# Patient Record
Sex: Female | Born: 1965 | Race: Black or African American | Hispanic: No | Marital: Single | Smoking: Never smoker
Health system: Southern US, Community
[De-identification: ages and names within clinical notes are randomized; demographics above are authoritative.]

## PROBLEM LIST (undated history)

## (undated) DIAGNOSIS — M1990 Unspecified osteoarthritis, unspecified site: Secondary | ICD-10-CM

## (undated) DIAGNOSIS — D649 Anemia, unspecified: Secondary | ICD-10-CM

## (undated) DIAGNOSIS — F329 Major depressive disorder, single episode, unspecified: Secondary | ICD-10-CM

## (undated) DIAGNOSIS — K219 Gastro-esophageal reflux disease without esophagitis: Secondary | ICD-10-CM

## (undated) DIAGNOSIS — I10 Essential (primary) hypertension: Secondary | ICD-10-CM

## (undated) DIAGNOSIS — F32A Depression, unspecified: Secondary | ICD-10-CM

## (undated) HISTORY — PX: COLONOSCOPY: SHX174

## (undated) HISTORY — DX: Gastro-esophageal reflux disease without esophagitis: K21.9

## (undated) HISTORY — PX: WISDOM TOOTH EXTRACTION: SHX21

## (undated) HISTORY — DX: Anemia, unspecified: D64.9

## (undated) HISTORY — DX: Depression, unspecified: F32.A

## (undated) HISTORY — PX: OTHER SURGICAL HISTORY: SHX169

## (undated) HISTORY — PX: OVARIAN CYST REMOVAL: SHX89

## (undated) HISTORY — PX: COLONOSCOPY W/ POLYPECTOMY: SHX1380

## (undated) HISTORY — PX: POLYPECTOMY: SHX149

## (undated) HISTORY — DX: Unspecified osteoarthritis, unspecified site: M19.90

---
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
## 1998-11-08 ENCOUNTER — Other Ambulatory Visit: Admission: RE | Admit: 1998-11-08 | Discharge: 1998-11-08 | Payer: Self-pay | Admitting: Obstetrics & Gynecology

## 1999-11-22 ENCOUNTER — Emergency Department (HOSPITAL_COMMUNITY): Admission: EM | Admit: 1999-11-22 | Discharge: 1999-11-22 | Payer: Self-pay | Admitting: Emergency Medicine

## 1999-11-23 ENCOUNTER — Encounter: Payer: Self-pay | Admitting: Emergency Medicine

## 2000-02-11 ENCOUNTER — Other Ambulatory Visit: Admission: RE | Admit: 2000-02-11 | Discharge: 2000-02-11 | Payer: Self-pay | Admitting: Obstetrics and Gynecology

## 2001-03-02 ENCOUNTER — Other Ambulatory Visit: Admission: RE | Admit: 2001-03-02 | Discharge: 2001-03-02 | Payer: Self-pay | Admitting: Obstetrics and Gynecology

## 2001-09-20 ENCOUNTER — Encounter: Admission: RE | Admit: 2001-09-20 | Discharge: 2001-09-20 | Payer: Self-pay | Admitting: Internal Medicine

## 2001-09-20 ENCOUNTER — Encounter: Payer: Self-pay | Admitting: Internal Medicine

## 2002-03-13 ENCOUNTER — Other Ambulatory Visit: Admission: RE | Admit: 2002-03-13 | Discharge: 2002-03-13 | Payer: Self-pay | Admitting: Obstetrics and Gynecology

## 2003-03-14 ENCOUNTER — Other Ambulatory Visit: Admission: RE | Admit: 2003-03-14 | Discharge: 2003-03-14 | Payer: Self-pay | Admitting: Obstetrics and Gynecology

## 2003-08-17 ENCOUNTER — Ambulatory Visit (HOSPITAL_COMMUNITY): Admission: RE | Admit: 2003-08-17 | Discharge: 2003-08-17 | Payer: Self-pay | Admitting: Obstetrics and Gynecology

## 2003-09-10 ENCOUNTER — Ambulatory Visit (HOSPITAL_COMMUNITY): Admission: RE | Admit: 2003-09-10 | Discharge: 2003-09-10 | Payer: Self-pay | Admitting: Obstetrics and Gynecology

## 2003-11-06 ENCOUNTER — Encounter (INDEPENDENT_AMBULATORY_CARE_PROVIDER_SITE_OTHER): Payer: Self-pay | Admitting: *Deleted

## 2003-11-06 ENCOUNTER — Ambulatory Visit (HOSPITAL_COMMUNITY): Admission: RE | Admit: 2003-11-06 | Discharge: 2003-11-06 | Payer: Self-pay | Admitting: Obstetrics and Gynecology

## 2004-03-21 ENCOUNTER — Other Ambulatory Visit: Admission: RE | Admit: 2004-03-21 | Discharge: 2004-03-21 | Payer: Self-pay | Admitting: Obstetrics and Gynecology

## 2004-12-22 ENCOUNTER — Emergency Department (HOSPITAL_COMMUNITY): Admission: EM | Admit: 2004-12-22 | Discharge: 2004-12-22 | Payer: Self-pay | Admitting: Emergency Medicine

## 2005-02-03 ENCOUNTER — Emergency Department (HOSPITAL_COMMUNITY): Admission: EM | Admit: 2005-02-03 | Discharge: 2005-02-03 | Payer: Self-pay | Admitting: Family Medicine

## 2005-03-27 ENCOUNTER — Other Ambulatory Visit: Admission: RE | Admit: 2005-03-27 | Discharge: 2005-03-27 | Payer: Self-pay | Admitting: Obstetrics and Gynecology

## 2005-04-14 ENCOUNTER — Encounter (INDEPENDENT_AMBULATORY_CARE_PROVIDER_SITE_OTHER): Payer: Self-pay | Admitting: Specialist

## 2005-04-14 ENCOUNTER — Ambulatory Visit (HOSPITAL_BASED_OUTPATIENT_CLINIC_OR_DEPARTMENT_OTHER): Admission: RE | Admit: 2005-04-14 | Discharge: 2005-04-14 | Payer: Self-pay | Admitting: General Surgery

## 2007-08-02 ENCOUNTER — Emergency Department (HOSPITAL_COMMUNITY): Admission: EM | Admit: 2007-08-02 | Discharge: 2007-08-02 | Payer: Self-pay | Admitting: Emergency Medicine

## 2008-05-31 ENCOUNTER — Encounter: Admission: RE | Admit: 2008-05-31 | Discharge: 2008-05-31 | Payer: Self-pay | Admitting: Obstetrics and Gynecology

## 2009-06-03 ENCOUNTER — Encounter: Admission: RE | Admit: 2009-06-03 | Discharge: 2009-06-03 | Payer: Self-pay | Admitting: Obstetrics and Gynecology

## 2009-09-20 ENCOUNTER — Ambulatory Visit (HOSPITAL_COMMUNITY): Admission: RE | Admit: 2009-09-20 | Discharge: 2009-09-20 | Payer: Self-pay | Admitting: Family Medicine

## 2010-02-12 ENCOUNTER — Emergency Department (HOSPITAL_COMMUNITY)
Admission: EM | Admit: 2010-02-12 | Discharge: 2010-02-12 | Payer: Self-pay | Source: Home / Self Care | Admitting: Emergency Medicine

## 2010-05-05 ENCOUNTER — Other Ambulatory Visit: Payer: Self-pay | Admitting: Obstetrics and Gynecology

## 2010-05-05 DIAGNOSIS — Z1231 Encounter for screening mammogram for malignant neoplasm of breast: Secondary | ICD-10-CM

## 2010-05-30 ENCOUNTER — Ambulatory Visit
Admission: RE | Admit: 2010-05-30 | Discharge: 2010-05-30 | Disposition: A | Discharge status: Home / Self Care | Payer: Medicare Other | Source: Ambulatory Visit | Attending: Obstetrics and Gynecology | Admitting: Obstetrics and Gynecology

## 2010-05-30 DIAGNOSIS — Z1231 Encounter for screening mammogram for malignant neoplasm of breast: Secondary | ICD-10-CM

## 2010-06-05 ENCOUNTER — Ambulatory Visit: Payer: Self-pay

## 2010-08-01 NOTE — H&P (Signed)
NAME:  Katherine Guzman, Katherine Guzman                           ACCOUNT NO.:  [id number]   MEDICAL RECORD NO.:  [id number]                   PATIENT TYPE:  AMB   LOCATION:  SDC                                  FACILITY:  [facility]   PHYSICIAN:  Naima A. Dillard, M.D.              DATE OF BIRTH:  1965/11/14   DATE OF ADMISSION:  11/06/2003  DATE OF DISCHARGE:                                HISTORY & PHYSICAL   CHIEF COMPLAINT:  Menorrhagia.   Patient is a 45 year old African-American female gravida 2, para 0-0-2-0 who  has a long-standing history of menorrhagia and one time she was anemic and  tried birth control pills and has last tried the patch.  Her last hemoglobin  was noted to be 12.  She was recently on a pill taper which did slow down  the menorrhagia and is on Levlin.  However, sonohysterogram was significant  for two small polyps.  Patient denies having any increased stress, any new  medication, or menopausal symptoms.  She does have some slight cramping with  her menses.   PAST MEDICAL HISTORY:  As above.   PAST SURGICAL HISTORY:  Unremarkable.   PAST OBSTETRICAL HISTORY:  Significant for spontaneous abortion x2 in the  first trimester without D&E.   MEDICATIONS:  Levlin.   ALLERGIES:  Patient has no known drug allergies.   SOCIAL HISTORY:  Negative for alcohol, tobacco, and drug use.   REVIEW OF SYSTEMS:  CARDIOVASCULAR, GASTROINTESTINAL, MUSCULOSKELETAL,  ENDOCRINE:  Unremarkable.  GENITOURINARY:  As above.   PHYSICAL EXAMINATION:  VITAL SIGNS:  Blood pressure 120/80, weight 205  pounds.  GENERAL:  She is a well-developed, well-nourished female in no apparent  distress.  HEART:  Regular rate and rhythm.  LUNGS:  Clear to auscultation bilaterally.  ABDOMEN:  Nondistended, soft, and nontender.  PELVIC:  Vaginal examination is without lesions.  No adnexal masses.  Uterus  is about 10 weeks size and irregular.  EXTREMITIES:  No clubbing, cyanosis, edema.   LABORATORIES:  On  sonohysterogram she had two posterior polyps.  One was 0.8  cm and one was 1 cm.  Hemoglobin was 12.4.  TSH was 1.373.  Prolactin is  normal.  Endometrial biopsy shows _____________ endometrium.  Ultrasound was  significant for a 10-week sized uterus measuring 6.8 x 7.4 cm size with  three fibroids, the largest being 3.3 cm.   ASSESSMENT:  1. Menorrhagia.  2. Fibroid.  3. Endometrial polyp.   PLAN:  D&C, hysteroscopy, polypectomy.  The patient understands the risks  are, but not limited to, bleeding, infection, damage to internal organs such  as bowel, bladder, major blood vessels with perforation of the uterus, also  laceration of the cervix.  Naima A. Normand Sloop, M.D.    NAD/MEDQ  D:  11/06/2003  T:  11/06/2003  Job:  [id number]

## 2010-08-01 NOTE — Op Note (Signed)
NAMETAKERA, RAYL NO.:  [id number]   MEDICAL RECORD NO.:  [id number]          PATIENT TYPE:  AMB   LOCATION:  DSC                          FACILITY:  [facility]   PHYSICIAN:  Gita Kudo, M.D. DATE OF BIRTH:  09-Jul-1965   DATE OF PROCEDURE:  04/14/2005  DATE OF DISCHARGE:                                 OPERATIVE REPORT   OPERATIVE PROCEDURE:  Excision left axillary mass.   SURGEON:  [place].   ANESTHESIA:  General.   PREOPERATIVE DIAGNOSIS:  Left axillary mass - large lipoma versus ectopic  breast tissue.   POSTOP DIAGNOSIS:  Pending pathology, mass was approximately 18 x 12 cm.   CLINICAL SUMMARY:  Overweight 45 year old woman with an enlarging mass in  her left axilla. It is been present since puberty and has enlarged slowly  over the years as she has gained weight and her breasts have gotten larger.   OPERATIVE FINDINGS:  There was some fairly firm fibrofatty tissue that may  have been just fat or could possibly have been breast tissue. In any event,  I removed it.   OPERATIVE PROCEDURE:  Under satisfactory general anesthesia, the patient was  positioned, prepped and draped in standard fashion. During the procedure  total of 40 mL of 0.5% Marcaine with epinephrine was infiltrated for postop  analgesia. An elliptical incision was made with a generous amount of loose  skin included and it was directed in the axis of the axilla. Then cautery  was used to make small flaps and to totally circumscribe the area. I went  medially to the pectoralis muscle, laterally to the latissimus and did not  enter the axilla. The specimen was removed and the wound lavaged with  saline, infiltrated with Marcaine and made hemostatic by cautery or Vicryl  ties. Then the deep subcu was approximated with 2-0 Vicryl and the skin  approximated with staples. A large Blake drain was led in inferiorly and  connected to suction before the wound was closed. A dressing was applied.  The patient went to the recovery room in good condition.           ______________________________  Gita Kudo, M.D.     MRL/MEDQ  D:  04/14/2005  T:  04/14/2005  Job:  [id number]

## 2010-08-01 NOTE — Op Note (Signed)
NAME:  Katherine Guzman, Katherine Guzman                           ACCOUNT NO.:  [id number]   MEDICAL RECORD NO.:  [id number]                   PATIENT TYPE:  AMB   LOCATION:  SDC                                  FACILITY:  [facility]   PHYSICIAN:  Naima A. Dillard, M.D.              DATE OF BIRTH:  [birth year]   DATE OF PROCEDURE:  11/06/2003  DATE OF DISCHARGE:                                 OPERATIVE REPORT   PREOPERATIVE DIAGNOSES:  1. Menorrhagia.  2. Fibroids.  3. Endometrial polyps.   POSTOPERATIVE DIAGNOSES:  1. Menorrhagia.  2. Fibroids.  3. Endometrial polyps.  4. Anterior cervical laceration that was obtained during surgery and also     repaired at surgery.   PROCEDURE:  D&C hysteroscopy, polypectomy, repair of anterior cervical  laceration.   ANESTHESIA:  MAC cervical block.   DEFICIT:  90 mL of 3% sorbitol.   COMPLICATIONS:  None.   FINDINGS:  Uterus descended to 9 cm, both ostia were visualized. There were  two small polyps on the posterior side of the uterus that were visualized.  Submucosal fibroid seen, fluffy endometrium.   DESCRIPTION OF PROCEDURE:  The patient was taken to the operating room where  she was placed in dorsal lithotomy position, given anesthesia, a bivalve  speculum was placed into the vagina, the anterior lip of the cervix was  grasped with a single tooth tenaculum. The cervix was then infiltrated for a  cervical block of 1% lidocaine, total of 10 mL.  The uterus then sounded to  10 cm.  It was then dilated with Shawnie Pons dilators up to a 21 and a  hysteroscope was placed into the uterus cavity.  The findings noted above  were seen and polyp forceps were placed through the uterine cavity and one  polyp was removed. The hysteroscope was then replaced and another polyp was  noted to be there. The cervix was then further dilated with Shawnie Pons dilators  up to 31. During the dilation, the tenaculum released from the anterior lip  of the cervix and a small laceration  occurred. The tenaculum was replaced in  the anterior lip of the cervix, the resectoscope was then placed into the  uterine cavity and the other polyp was then removed. The hysteroscope was  then removed. Endometrial curettings were then obtained with a curette until  a gritty texture was noted. Hemostasis was noted and there was some bleeding  at the anterior laceration site.  Again another  hysteroscope was placed into the uterine cavity and all polyps were seen to  be removed. All instruments were removed from the cervix. The cervix wall  laceration was repaired with a UR-6 on a 2-0 Vicryl suture.  Hemostasis was  noted.  Sponge, lap and needle counts were correct x2.  Hemostasis was noted  at the tenaculum site.  Naima A. Normand Sloop, M.D.    NAD/MEDQ  D:  11/06/2003  T:  11/06/2003  Job:  [id number]

## 2011-03-02 ENCOUNTER — Emergency Department (HOSPITAL_COMMUNITY)
Admission: EM | Admit: 2011-03-02 | Discharge: 2011-03-02 | Disposition: A | Discharge status: Home / Self Care | Payer: Medicare Other | Attending: Emergency Medicine | Admitting: Emergency Medicine

## 2011-03-02 ENCOUNTER — Other Ambulatory Visit: Payer: Self-pay

## 2011-03-02 ENCOUNTER — Encounter (HOSPITAL_COMMUNITY): Payer: Self-pay | Admitting: Emergency Medicine

## 2011-03-02 DIAGNOSIS — I10 Essential (primary) hypertension: Secondary | ICD-10-CM | POA: Insufficient documentation

## 2011-03-02 DIAGNOSIS — F411 Generalized anxiety disorder: Secondary | ICD-10-CM | POA: Insufficient documentation

## 2011-03-02 DIAGNOSIS — R079 Chest pain, unspecified: Secondary | ICD-10-CM | POA: Insufficient documentation

## 2011-03-02 DIAGNOSIS — E119 Type 2 diabetes mellitus without complications: Secondary | ICD-10-CM | POA: Insufficient documentation

## 2011-03-02 DIAGNOSIS — R209 Unspecified disturbances of skin sensation: Secondary | ICD-10-CM | POA: Insufficient documentation

## 2011-03-02 DIAGNOSIS — R42 Dizziness and giddiness: Secondary | ICD-10-CM | POA: Insufficient documentation

## 2011-03-02 DIAGNOSIS — F419 Anxiety disorder, unspecified: Secondary | ICD-10-CM

## 2011-03-02 HISTORY — DX: Essential (primary) hypertension: I10

## 2011-03-02 LAB — POCT I-STAT, CHEM 8
Chloride: 102 mEq/L (ref 96–112)
Creatinine, Ser: 0.9 mg/dL (ref 0.50–1.10)
Glucose, Bld: 82 mg/dL (ref 70–99)
HCT: 38 % (ref 36.0–46.0)
Hemoglobin: 12.9 g/dL (ref 12.0–15.0)
Sodium: 139 mEq/L (ref 135–145)

## 2011-03-02 MED ORDER — LORAZEPAM 1 MG PO TABS: 1.0000 mg | ORAL_TABLET | Freq: Three times a day (TID) | ORAL | Status: AC | PRN

## 2011-03-02 NOTE — ED Notes (Signed)
Pt states onset last pm, headaches, chest discomfort, and dizziness. Pt also states has been stressed out a lot lately and sometimes it is hard to breathe, pt states s/s happen all at once and come and go.

## 2011-03-02 NOTE — ED Provider Notes (Signed)
History     CSN: [id number] Arrival date & time: 03/02/2011 12:06 PM   First MD Initiated Contact with Patient 03/02/11 1521      Chief Complaint  Patient presents with  . Dizziness    (Consider location/radiation/quality/duration/timing/severity/associated sxs/prior treatment) HPI Comments: Pt says that she has had chest pain, tingling in the hands and dizziness on and off since Friday, 3 days ago.  She thinks it may be caused by worry and stress.  There has been on vertigo , no fever, and no presyncopal feeling.  She is diabetic and hypertensive, followed for this by Larita Fife, M.D., her family physician.  Patient is a 45 y.o. female presenting with anxiety.  Anxiety This is a new problem. The current episode started more than 2 days ago. Episode frequency: She feels chest pain, tingling in the hands, and dizziness intermittently. The problem has not changed since onset.Associated symptoms include chest pain. The symptoms are aggravated by nothing. The symptoms are relieved by nothing. She has tried nothing for the symptoms.    Past Medical History  Diagnosis Date  . Diabetes mellitus   . Hypertension     Past Surgical History  Procedure Date  . Colonoscopy w/ polypectomy     History reviewed. No pertinent family history.  History  Substance Use Topics  . Smoking status: Never Smoker   . Smokeless tobacco: Not on file  . Alcohol Use:     OB History    Grav Para Term Preterm Abortions TAB SAB Ect Mult Living                  Review of Systems  Constitutional: Negative.   HENT: Negative.   Eyes: Negative.   Respiratory: Negative.   Cardiovascular: Positive for chest pain.  Gastrointestinal: Negative.   Genitourinary: Negative.   Musculoskeletal: Negative.   Skin: Negative.   Neurological: Positive for dizziness and numbness.  Hematological: Negative.   Psychiatric/Behavioral: Negative.     Allergies  Review of patient's allergies indicates no known  allergies.  Home Medications   Current Outpatient Rx  Name Route Sig Dispense Refill  . ATORVASTATIN CALCIUM 20 MG PO TABS Oral Take 20 mg by mouth daily.      . DESOGESTREL-ETHINYL ESTRADIOL 0.15-30 MG-MCG PO TABS Oral Take 1 tablet by mouth daily.      Marland Kitchen HYDROCHLOROTHIAZIDE 25 MG PO TABS Oral Take 25 mg by mouth daily.      Marland Kitchen OMEPRAZOLE 40 MG PO CPDR Oral Take 40 mg by mouth daily.        BP 145/82  Pulse 88  Temp(Src) 98.8 F (37.1 C) (Oral)  Resp 16  SpO2 100%  LMP 02/26/2011  Physical Exam  Nursing note and vitals reviewed. Constitutional: She is oriented to person, place, and time. She appears well-developed and well-nourished. No distress.  HENT:  Head: Normocephalic and atraumatic.  Mouth/Throat: Oropharynx is clear and moist.  Eyes: Conjunctivae and EOM are normal. Pupils are equal, round, and reactive to light.       No nystagmus.  Neck: Normal range of motion. Neck supple.  Cardiovascular: Normal rate, regular rhythm and intact distal pulses.   Pulmonary/Chest: Effort normal and breath sounds normal.  Abdominal: Soft. Bowel sounds are normal.  Musculoskeletal: Normal range of motion.  Neurological: She is alert and oriented to person, place, and time.       No sensory or motor deficit.  Skin: Skin is warm and dry.  Psychiatric: She has a normal  mood and affect. Her behavior is normal.    ED Course  Procedures (including critical care time)   Labs Reviewed  I-STAT, CHEM 8   3:35 PM Pt seen --> physical exam performed.  ISTAT8 and EKG ordered.  3:46 PM  Date: 03/02/2011  Rate: 77  Rhythm: normal sinus rhythm  QRS Axis: normal  Intervals: normal  ST/T Wave abnormalities: normal  Conduction Disutrbances:none  Narrative Interpretation: Normal EKG  Old EKG Reviewed: none available  5:02 PM Lab and EKG are negative.  Rx Ativan 1 mg q6h prn anxiety.    1. Anxiety          Carleene Cooper III, MD 03/02/11 [phone]

## 2011-04-27 ENCOUNTER — Other Ambulatory Visit: Payer: Self-pay | Admitting: Obstetrics and Gynecology

## 2011-04-27 DIAGNOSIS — Z1231 Encounter for screening mammogram for malignant neoplasm of breast: Secondary | ICD-10-CM

## 2011-05-27 ENCOUNTER — Ambulatory Visit (INDEPENDENT_AMBULATORY_CARE_PROVIDER_SITE_OTHER): Payer: Medicare Other | Admitting: Obstetrics and Gynecology

## 2011-05-27 DIAGNOSIS — Z124 Encounter for screening for malignant neoplasm of cervix: Secondary | ICD-10-CM

## 2011-05-27 DIAGNOSIS — Z0000 Encounter for general adult medical examination without abnormal findings: Secondary | ICD-10-CM

## 2011-06-01 ENCOUNTER — Ambulatory Visit
Admission: RE | Admit: 2011-06-01 | Discharge: 2011-06-01 | Disposition: A | Discharge status: Home / Self Care | Payer: Medicare Other | Source: Ambulatory Visit | Attending: Obstetrics and Gynecology | Admitting: Obstetrics and Gynecology

## 2011-06-01 DIAGNOSIS — Z1231 Encounter for screening mammogram for malignant neoplasm of breast: Secondary | ICD-10-CM

## 2011-11-06 ENCOUNTER — Encounter (INDEPENDENT_AMBULATORY_CARE_PROVIDER_SITE_OTHER): Payer: Medicare Other | Admitting: Ophthalmology

## 2011-11-06 DIAGNOSIS — I10 Essential (primary) hypertension: Secondary | ICD-10-CM

## 2011-11-06 DIAGNOSIS — E11319 Type 2 diabetes mellitus with unspecified diabetic retinopathy without macular edema: Secondary | ICD-10-CM

## 2011-11-06 DIAGNOSIS — H35379 Puckering of macula, unspecified eye: Secondary | ICD-10-CM

## 2011-11-06 DIAGNOSIS — H35039 Hypertensive retinopathy, unspecified eye: Secondary | ICD-10-CM

## 2011-11-06 DIAGNOSIS — E1139 Type 2 diabetes mellitus with other diabetic ophthalmic complication: Secondary | ICD-10-CM

## 2011-11-06 DIAGNOSIS — H3530 Unspecified macular degeneration: Secondary | ICD-10-CM

## 2011-11-06 DIAGNOSIS — H2510 Age-related nuclear cataract, unspecified eye: Secondary | ICD-10-CM

## 2011-11-06 DIAGNOSIS — H43819 Vitreous degeneration, unspecified eye: Secondary | ICD-10-CM

## 2012-04-15 ENCOUNTER — Emergency Department (HOSPITAL_COMMUNITY): Payer: Medicare Other

## 2012-04-15 ENCOUNTER — Encounter (HOSPITAL_COMMUNITY): Payer: Self-pay | Admitting: Emergency Medicine

## 2012-04-15 ENCOUNTER — Emergency Department (HOSPITAL_COMMUNITY)
Admission: EM | Admit: 2012-04-15 | Discharge: 2012-04-15 | Disposition: A | Discharge status: Home / Self Care | Payer: Medicare Other | Attending: Emergency Medicine | Admitting: Emergency Medicine

## 2012-04-15 DIAGNOSIS — Z794 Long term (current) use of insulin: Secondary | ICD-10-CM | POA: Insufficient documentation

## 2012-04-15 DIAGNOSIS — M25531 Pain in right wrist: Secondary | ICD-10-CM

## 2012-04-15 DIAGNOSIS — S59919A Unspecified injury of unspecified forearm, initial encounter: Secondary | ICD-10-CM | POA: Insufficient documentation

## 2012-04-15 DIAGNOSIS — E119 Type 2 diabetes mellitus without complications: Secondary | ICD-10-CM | POA: Insufficient documentation

## 2012-04-15 DIAGNOSIS — Z79899 Other long term (current) drug therapy: Secondary | ICD-10-CM | POA: Insufficient documentation

## 2012-04-15 DIAGNOSIS — E7800 Pure hypercholesterolemia, unspecified: Secondary | ICD-10-CM | POA: Insufficient documentation

## 2012-04-15 DIAGNOSIS — S6990XA Unspecified injury of unspecified wrist, hand and finger(s), initial encounter: Secondary | ICD-10-CM | POA: Insufficient documentation

## 2012-04-15 DIAGNOSIS — Y92410 Unspecified street and highway as the place of occurrence of the external cause: Secondary | ICD-10-CM | POA: Insufficient documentation

## 2012-04-15 DIAGNOSIS — Y939 Activity, unspecified: Secondary | ICD-10-CM | POA: Insufficient documentation

## 2012-04-15 DIAGNOSIS — S59909A Unspecified injury of unspecified elbow, initial encounter: Secondary | ICD-10-CM | POA: Insufficient documentation

## 2012-04-15 DIAGNOSIS — I10 Essential (primary) hypertension: Secondary | ICD-10-CM | POA: Insufficient documentation

## 2012-04-15 MED ORDER — TRAMADOL HCL 50 MG PO TABS: 50.0000 mg | ORAL_TABLET | Freq: Four times a day (QID) | ORAL | Status: AC | PRN

## 2012-04-15 NOTE — ED Provider Notes (Signed)
Medical screening examination/treatment/procedure(s) were performed by non-physician practitioner and as supervising physician I was immediately available for consultation/collaboration.   Dione Booze, MD 04/15/12 [phone]
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
## 2012-05-04 ENCOUNTER — Other Ambulatory Visit: Payer: Self-pay | Admitting: Obstetrics and Gynecology

## 2012-05-04 DIAGNOSIS — Z1231 Encounter for screening mammogram for malignant neoplasm of breast: Secondary | ICD-10-CM

## 2012-06-03 ENCOUNTER — Ambulatory Visit: Payer: Medicare Other

## 2012-07-13 ENCOUNTER — Ambulatory Visit: Payer: Medicare Other

## 2012-07-15 ENCOUNTER — Ambulatory Visit
Admission: RE | Admit: 2012-07-15 | Discharge: 2012-07-15 | Disposition: A | Discharge status: Home / Self Care | Payer: Medicare Other | Source: Ambulatory Visit | Attending: Obstetrics and Gynecology | Admitting: Obstetrics and Gynecology

## 2012-07-15 DIAGNOSIS — Z1231 Encounter for screening mammogram for malignant neoplasm of breast: Secondary | ICD-10-CM
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
## 2013-07-12 ENCOUNTER — Other Ambulatory Visit: Payer: Self-pay

## 2013-07-12 DIAGNOSIS — Z1231 Encounter for screening mammogram for malignant neoplasm of breast: Secondary | ICD-10-CM

## 2013-08-04 ENCOUNTER — Ambulatory Visit
Admission: RE | Admit: 2013-08-04 | Discharge: 2013-08-04 | Disposition: A | Discharge status: Home / Self Care | Payer: Medicare Other | Source: Ambulatory Visit

## 2013-08-04 DIAGNOSIS — Z1231 Encounter for screening mammogram for malignant neoplasm of breast: Secondary | ICD-10-CM

## 2014-06-06 ENCOUNTER — Other Ambulatory Visit: Payer: Self-pay

## 2014-06-06 DIAGNOSIS — Z1231 Encounter for screening mammogram for malignant neoplasm of breast: Secondary | ICD-10-CM

## 2014-06-22 ENCOUNTER — Ambulatory Visit
Admission: RE | Admit: 2014-06-22 | Discharge: 2014-06-22 | Disposition: A | Discharge status: Home / Self Care | Payer: Medicare Other | Source: Ambulatory Visit

## 2014-06-22 DIAGNOSIS — Z1231 Encounter for screening mammogram for malignant neoplasm of breast: Secondary | ICD-10-CM

## 2014-08-06 ENCOUNTER — Ambulatory Visit: Payer: Medicare Other

## 2014-08-08 ENCOUNTER — Ambulatory Visit
Admission: RE | Admit: 2014-08-08 | Discharge: 2014-08-08 | Disposition: A | Discharge status: Home / Self Care | Payer: Medicare Other | Source: Ambulatory Visit

## 2014-08-08 ENCOUNTER — Ambulatory Visit: Admission: RE | Admit: 2014-08-08 | Payer: Medicare Other | Source: Ambulatory Visit

## 2014-08-08 ENCOUNTER — Other Ambulatory Visit: Payer: Self-pay

## 2014-08-08 DIAGNOSIS — Z1231 Encounter for screening mammogram for malignant neoplasm of breast: Secondary | ICD-10-CM

## 2015-06-19 ENCOUNTER — Other Ambulatory Visit: Payer: Self-pay

## 2015-06-19 DIAGNOSIS — Z1231 Encounter for screening mammogram for malignant neoplasm of breast: Secondary | ICD-10-CM

## 2015-07-29 DIAGNOSIS — D259 Leiomyoma of uterus, unspecified: Secondary | ICD-10-CM | POA: Insufficient documentation

## 2015-08-09 ENCOUNTER — Ambulatory Visit
Admission: RE | Admit: 2015-08-09 | Discharge: 2015-08-09 | Disposition: A | Discharge status: Home / Self Care | Payer: Medicare Other | Source: Ambulatory Visit

## 2015-08-09 DIAGNOSIS — Z1231 Encounter for screening mammogram for malignant neoplasm of breast: Secondary | ICD-10-CM

## 2017-09-09 DIAGNOSIS — N926 Irregular menstruation, unspecified: Secondary | ICD-10-CM | POA: Insufficient documentation

## 2017-09-28 DIAGNOSIS — B977 Papillomavirus as the cause of diseases classified elsewhere: Secondary | ICD-10-CM | POA: Insufficient documentation

## 2018-03-23 DIAGNOSIS — G8929 Other chronic pain: Secondary | ICD-10-CM | POA: Insufficient documentation

## 2018-03-23 DIAGNOSIS — M5442 Lumbago with sciatica, left side: Secondary | ICD-10-CM | POA: Insufficient documentation

## 2019-01-06 ENCOUNTER — Encounter: Payer: Self-pay | Admitting: Gastroenterology

## 2019-01-31 ENCOUNTER — Ambulatory Visit (AMBULATORY_SURGERY_CENTER): Payer: Medicare Other | Admitting: *Deleted

## 2019-01-31 ENCOUNTER — Other Ambulatory Visit: Payer: Self-pay

## 2019-01-31 VITALS — Temp 97.3°F | Ht 63.0 in | Wt 217.0 lb

## 2019-01-31 DIAGNOSIS — Z1159 Encounter for screening for other viral diseases: Secondary | ICD-10-CM

## 2019-01-31 DIAGNOSIS — Z1211 Encounter for screening for malignant neoplasm of colon: Secondary | ICD-10-CM

## 2019-01-31 MED ORDER — SUPREP BOWEL PREP KIT 17.5-3.13-1.6 GM/177ML PO SOLN: 1.0000 | Freq: Once | ORAL | 0 refills | Status: AC

## 2019-01-31 NOTE — Progress Notes (Signed)
No egg or soy allergy known to patient  No issues with past sedation with any surgeries  or procedures, no intubation problems  No diet pills per patient No home 02 use per patient  No blood thinners per patient  Pt denies issues with constipation  No A fib or A flutter  EMMI video sent to pt's e mail   cov test 11-24 Tuesday at 46 am   Pt states she was planning to ride the bus here and her sister was planning on coming later- Explained to her sister will have to be here by 80 am with pt-  Pt verbalized understanding   Due to the COVID-19 pandemic we are asking patients to follow these guidelines. Please only bring one care partner. Please be aware that your care partner may wait in the car in the parking lot or if they feel like they will be too hot to wait in the car, they may wait in the lobby on the 4th floor. All care partners are required to wear a mask the entire time (we do not have any that we can provide them), they need to practice social distancing, and we will do a Covid check for all patient's and care partners when you arrive. Also we will check their temperature and your temperature. If the care partner waits in their car they need to stay in the parking lot the entire time and we will call them on their cell phone when the patient is ready for discharge so they can bring the car to the front of the building. Also all patient's will need to wear a mask into building.

## 2019-02-07 ENCOUNTER — Other Ambulatory Visit: Payer: Self-pay | Admitting: Gastroenterology

## 2019-02-07 ENCOUNTER — Ambulatory Visit (INDEPENDENT_AMBULATORY_CARE_PROVIDER_SITE_OTHER): Payer: Medicare Other

## 2019-02-07 DIAGNOSIS — Z1159 Encounter for screening for other viral diseases: Secondary | ICD-10-CM

## 2019-02-08 LAB — SARS CORONAVIRUS 2 (TAT 6-24 HRS): SARS Coronavirus 2: NEGATIVE

## 2019-02-14 ENCOUNTER — Encounter: Payer: Self-pay | Admitting: Gastroenterology

## 2019-02-14 ENCOUNTER — Ambulatory Visit (AMBULATORY_SURGERY_CENTER): Payer: Medicare Other | Admitting: Gastroenterology

## 2019-02-14 ENCOUNTER — Other Ambulatory Visit: Payer: Self-pay

## 2019-02-14 VITALS — BP 121/70 | HR 64 | Temp 98.6°F | Resp 19 | Ht 63.0 in | Wt 217.0 lb

## 2019-02-14 DIAGNOSIS — Z1211 Encounter for screening for malignant neoplasm of colon: Secondary | ICD-10-CM

## 2019-02-14 MED ORDER — SODIUM CHLORIDE 0.9 % IV SOLN: 500.0000 mL | Freq: Once | INTRAVENOUS | Status: DC

## 2019-02-14 NOTE — Op Note (Signed)
[place] Patient Name: Katherine Guzman Procedure Date: 02/14/2019 9:14 AM MRN: [id number] Endoscopist: Ladene Artist , MD Age: 53 Referring MD:  Date of Birth: 01/04/1966 Gender: Female Account #: [id number] Procedure:                Colonoscopy Indications:              Screening for colorectal malignant neoplasm Medicines:                Monitored Anesthesia Care Procedure:                Pre-Anesthesia Assessment:                           - Prior to the procedure, a History and Physical                            was performed, and patient medications and                            allergies were reviewed. The patient's tolerance of                            previous anesthesia was also reviewed. The risks                            and benefits of the procedure and the sedation                            options and risks were discussed with the patient.                            All questions were answered, and informed consent                            was obtained. Prior Anticoagulants: The patient has                            taken no previous anticoagulant or antiplatelet                            agents. ASA Grade Assessment: II - A patient with                            mild systemic disease. After reviewing the risks                            and benefits, the patient was deemed in                            satisfactory condition to undergo the procedure.                           After obtaining informed consent, the colonoscope  was passed under direct vision. Throughout the                            procedure, the patient's blood pressure, pulse, and                            oxygen saturations were monitored continuously. The                            Colonoscope was introduced through the anus and                            advanced to the the cecum, identified by                            appendiceal orifice and  ileocecal valve. The                            ileocecal valve, appendiceal orifice, and rectum                            were photographed. The quality of the bowel                            preparation was good. The colonoscopy was performed                            without difficulty. The patient tolerated the                            procedure well. Scope In: 11:16:38 AM Scope Out: 11:30:14 AM Scope Withdrawal Time: 0 hours 10 minutes 39 seconds  Total Procedure Duration: 0 hours 13 minutes 36 seconds  Findings:                 The perianal and digital rectal examinations were                            normal.                           Internal hemorrhoids were found during                            retroflexion. The hemorrhoids were small and Grade                            I (internal hemorrhoids that do not prolapse).                           The exam was otherwise without abnormality on                            direct and retroflexion views. Complications:            No immediate complications. Estimated blood loss:  None. Estimated Blood Loss:     Estimated blood loss: none. Impression:               - Internal hemorrhoids.                           - The examination was otherwise normal on direct                            and retroflexion views.                           - No specimens collected. Recommendation:           - Repeat colonoscopy in 10 years for screening                            purposes.                           - Patient has a contact number available for                            emergencies. The signs and symptoms of potential                            delayed complications were discussed with the                            patient. Return to normal activities tomorrow.                            Written discharge instructions were provided to the                            patient.                           -  Resume previous diet.                           - Continue present medications. Ladene Artist, MD 02/14/2019 11:33:23 AM This report has been signed electronically.

## 2019-02-14 NOTE — Progress Notes (Signed)
Temp check by:JB Vital check by:CW  The patient states no changes in medical or surgical history since pre-visit screening on 01/31/2019.

## 2019-02-14 NOTE — Patient Instructions (Signed)
Please read handouts provided. Continue present medications.     YOU HAD AN ENDOSCOPIC PROCEDURE TODAY AT THE [facility]:   Refer to the procedure report that was given to you for any specific questions about what was found during the examination.  If the procedure report does not answer your questions, please call your gastroenterologist to clarify.  If you requested that your care partner not be given the details of your procedure findings, then the procedure report has been included in a sealed envelope for you to review at your convenience later.  YOU SHOULD EXPECT: Some feelings of bloating in the abdomen. Passage of more gas than usual.  Walking can help get rid of the air that was put into your GI tract during the procedure and reduce the bloating. If you had a lower endoscopy (such as a colonoscopy or flexible sigmoidoscopy) you may notice spotting of blood in your stool or on the toilet paper. If you underwent a bowel prep for your procedure, you may not have a normal bowel movement for a few days.  Please Note:  You might notice some irritation and congestion in your nose or some drainage.  This is from the oxygen used during your procedure.  There is no need for concern and it should clear up in a day or so.  SYMPTOMS TO REPORT IMMEDIATELY:   Following lower endoscopy (colonoscopy or flexible sigmoidoscopy):  Excessive amounts of blood in the stool  Significant tenderness or worsening of abdominal pains  Swelling of the abdomen that is new, acute  Fever of 100F or higher   For urgent or emergent issues, a gastroenterologist can be reached at any hour by calling [phone].   DIET:  We do recommend a small meal at first, but then you may proceed to your regular diet.  Drink plenty of fluids but you should avoid alcoholic beverages for 24 hours.  ACTIVITY:  You should plan to take it easy for the rest of today and you should NOT DRIVE or use heavy machinery  until tomorrow (because of the sedation medicines used during the test).    FOLLOW UP: Our staff will call the number listed on your records 48-72 hours following your procedure to check on you and address any questions or concerns that you may have regarding the information given to you following your procedure. If we do not reach you, we will leave a message.  We will attempt to reach you two times.  During this call, we will ask if you have developed any symptoms of COVID 19. If you develop any symptoms (ie: fever, flu-like symptoms, shortness of breath, cough etc.) before then, please call [phone].  If you test positive for Covid 19 in the 2 weeks post procedure, please call and report this information to us.    If any biopsies were taken you will be contacted by phone or by letter within the next 1-3 weeks.  Please call us at [phone] if you have not heard about the biopsies in 3 weeks.    SIGNATURES/CONFIDENTIALITY: You and/or your care partner have signed paperwork which will be entered into your electronic medical record.  These signatures attest to the fact that that the information above on your After Visit Summary has been reviewed and is understood.  Full responsibility of the confidentiality of this discharge information lies with you and/or your care-partner. 

## 2019-02-14 NOTE — Progress Notes (Signed)
Report to PACU, RN, vss, BBS= Clear.  

## 2019-02-16 ENCOUNTER — Telehealth: Payer: Self-pay | Admitting: *Deleted

## 2019-02-16 NOTE — Telephone Encounter (Signed)
  Follow up Call-  Call back number 02/14/2019  Post procedure Call Back phone  # [phone]  Permission to leave phone message Yes  Some recent data might be hidden     Patient questions:  Do you have a fever, pain , or abdominal swelling? No. Pain Score  0 *  Have you tolerated food without any problems? Yes.    Have you been able to return to your normal activities? Yes.    Do you have any questions about your discharge instructions: Diet   No. Medications  No. Follow up visit  No.  Do you have questions or concerns about your Care? No.  Actions: * If pain score is 4 or above: No action needed, pain <4.  1. Have you developed a fever since your procedure? no  2.   Have you had an respiratory symptoms (SOB or cough) since your procedure? no  3.   Have you tested positive for COVID 19 since your procedure no  4.   Have you had any family members/close contacts diagnosed with the COVID 19 since your procedure?  no   If yes to any of these questions please route to Joylene John, RN and Alphonsa Gin, Therapist, sports.

## 2020-01-08 ENCOUNTER — Other Ambulatory Visit: Payer: Self-pay

## 2020-01-08 ENCOUNTER — Emergency Department (HOSPITAL_COMMUNITY)
Admission: EM | Admit: 2020-01-08 | Discharge: 2020-01-09 | Disposition: A | Discharge status: Home / Self Care | Payer: Medicare Other | Attending: Emergency Medicine | Admitting: Emergency Medicine

## 2020-01-08 ENCOUNTER — Encounter (HOSPITAL_COMMUNITY): Payer: Self-pay | Admitting: Emergency Medicine

## 2020-01-08 DIAGNOSIS — Z20822 Contact with and (suspected) exposure to covid-19: Secondary | ICD-10-CM | POA: Diagnosis not present

## 2020-01-08 DIAGNOSIS — R1012 Left upper quadrant pain: Secondary | ICD-10-CM | POA: Insufficient documentation

## 2020-01-08 DIAGNOSIS — R112 Nausea with vomiting, unspecified: Secondary | ICD-10-CM | POA: Diagnosis not present

## 2020-01-08 DIAGNOSIS — Z79899 Other long term (current) drug therapy: Secondary | ICD-10-CM | POA: Insufficient documentation

## 2020-01-08 DIAGNOSIS — I10 Essential (primary) hypertension: Secondary | ICD-10-CM | POA: Insufficient documentation

## 2020-01-08 DIAGNOSIS — Z794 Long term (current) use of insulin: Secondary | ICD-10-CM | POA: Diagnosis not present

## 2020-01-08 DIAGNOSIS — E119 Type 2 diabetes mellitus without complications: Secondary | ICD-10-CM | POA: Insufficient documentation

## 2020-01-08 DIAGNOSIS — M791 Myalgia, unspecified site: Secondary | ICD-10-CM | POA: Diagnosis not present

## 2020-01-08 DIAGNOSIS — R52 Pain, unspecified: Secondary | ICD-10-CM | POA: Diagnosis present

## 2020-01-08 LAB — RESPIRATORY PANEL BY RT PCR (FLU A&B, COVID)
Influenza A by PCR: NEGATIVE
Influenza B by PCR: NEGATIVE
SARS Coronavirus 2 by RT PCR: NEGATIVE

## 2020-01-08 NOTE — ED Triage Notes (Signed)
Patient arrives to ED with complaints of generalized body aches, chills, nausea, and headache that started today. Denies CP, SOB, abd pain, and dysuria.

## 2020-01-09 LAB — CBG MONITORING, ED: Glucose-Capillary: 91 mg/dL (ref 70–99)

## 2020-01-09 MED ORDER — ONDANSETRON 4 MG PO TBDP: 4.0000 mg | ORAL_TABLET | Freq: Three times a day (TID) | ORAL | 0 refills | Status: AC | PRN

## 2020-01-09 NOTE — ED Provider Notes (Signed)
[facility] EMERGENCY DEPARTMENT Provider Note  CSN: [id number] Arrival date & time: 01/08/20 1746  Chief Complaint(s) Generalized Body Aches  HPI FABLE HUISMAN is a 54 y.o. female   The history is provided by the patient.  Emesis Severity:  Moderate Duration:  1 day Timing:  Intermittent Number of daily episodes:  5 Quality:  Stomach contents Progression:  Improving Chronicity:  New Recent urination:  Normal Context: not post-tussive and not self-induced   Relieved by:  Nothing Worsened by:  Nothing Associated symptoms: abdominal pain (LUQ, resolved), chills, headaches (mild, resolved) and myalgias   Associated symptoms: no cough, no diarrhea and no fever   Risk factors: diabetes (h/o; used to be on insulin. currently off and diet controlled per patient)   Risk factors: no alcohol use, no prior abdominal surgery, no sick contacts, no suspect food intake and no travel to endemic areas     Past Medical History Past Medical History:  Diagnosis Date  . Anemia    when younger   . Arthritis    kness back hip  . Depression   . Diabetes mellitus   . GERD (gastroesophageal reflux disease)   . Hypertension    Patient Active Problem List   Diagnosis Date Noted  . Chronic midline low back pain with bilateral sciatica 03/23/2018  . Severe obesity (BMI 35.0-39.9) with comorbidity ([place]) 03/23/2018  . HPV (human papilloma virus) infection 09/28/2017  . Irregular periods 09/09/2017  . Uterine leiomyoma 07/29/2015  . Impaired fasting blood sugar 06/03/2011  . Arthritis 04/27/2011  . Essential hypertension 04/27/2011  . GERD (gastroesophageal reflux disease) 04/27/2011   Home Medication(s) Prior to Admission medications   Medication Sig Start Date End Date Taking? Authorizing Provider  acetaminophen (TYLENOL) 500 MG tablet Take by mouth.    [provider]  amLODipine (NORVASC) 2.5 MG tablet Take 2.5 mg by mouth daily. 01/23/19   [provider]  atorvastatin (LIPITOR) 20 MG tablet Take 20 mg by mouth daily.      [provider]  CALCIUM & MAGNESIUM CARBONATES PO Take by mouth. 05/17/15   [provider]  diclofenac (VOLTAREN) 75 MG EC tablet Take by mouth. 03/23/18   [provider]  ferrous sulfate 325 (65 FE) MG tablet Take by mouth. 10/04/18   [provider]  gabapentin (NEURONTIN) 100 MG capsule Take by mouth. 10/04/18   [provider]  hydrochlorothiazide (HYDRODIURIL) 25 MG tablet Take 25 mg by mouth daily.      [provider]  insulin glargine (LANTUS) 100 UNIT/ML injection Inject into the skin at bedtime.      [provider]  lisinopril (ZESTRIL) 10 MG tablet Take by mouth. 01/04/19   [provider]  Multiple Vitamin (MULTIVITAMIN) capsule Take by mouth.    [provider]  norethindrone (MICRONOR) 0.35 MG tablet Take by mouth. 05/23/14   [provider]  omeprazole (PRILOSEC) 40 MG capsule Take 40 mg by mouth daily.      [provider]  ondansetron (ZOFRAN ODT) 4 MG disintegrating tablet Take 1 tablet (4 mg total) by mouth every 8 (eight) hours as needed for up to 3 days for nausea or vomiting. 01/09/20 01/12/20  Fatima Blank, MD  traMADol (ULTRAM) 50 MG tablet Take 1 tablet (50 mg total) by mouth every 6 (six) hours as needed for pain. 04/15/12   Muthersbaugh, Jarrett Soho, PA-C  Past Surgical History Past Surgical History:  Procedure Laterality Date  . COLONOSCOPY    . COLONOSCOPY W/ POLYPECTOMY    . cyst removed from axilla area    . OVARIAN CYST REMOVAL    . POLYPECTOMY    . WISDOM TOOTH EXTRACTION     Family History Family History  Problem Relation Age of Onset  . Colon cancer Neg Hx   . Colon polyps Neg Hx   . Esophageal cancer Neg Hx   . Rectal cancer Neg Hx   .  Stomach cancer Neg Hx     Social History Social History   Tobacco Use  . Smoking status: Never Smoker  . Smokeless tobacco: Never Used  Substance Use Topics  . Alcohol use: Never  . Drug use: Never   Allergies Lisinopril and Penicillins  Review of Systems Review of Systems  Constitutional: Positive for chills. Negative for fever.  Respiratory: Negative for cough.   Gastrointestinal: Positive for abdominal pain (LUQ, resolved) and vomiting. Negative for diarrhea.  Musculoskeletal: Positive for myalgias.  Neurological: Positive for headaches (mild, resolved).   All other systems are reviewed and are negative for acute change except as noted in the HPI  Physical Exam Vital Signs  I have reviewed the triage vital signs BP (!) 142/94 (BP Location: Left Arm)   Pulse 66   Temp 98.2 F (36.8 C) (Oral)   Resp 17   Ht 5\' 3"  (1.6 m)   Wt 103.4 kg   SpO2 97%   BMI 40.39 kg/m   Physical Exam Vitals reviewed.  Constitutional:      General: She is not in acute distress.    Appearance: She is well-developed. She is not diaphoretic.  HENT:     Head: Normocephalic and atraumatic.     Right Ear: External ear normal.     Left Ear: External ear normal.     Nose: Nose normal.  Eyes:     General: No scleral icterus.    Conjunctiva/sclera: Conjunctivae normal.  Neck:     Trachea: Phonation normal.  Cardiovascular:     Rate and Rhythm: Normal rate and regular rhythm.  Pulmonary:     Effort: Pulmonary effort is normal. No respiratory distress.     Breath sounds: No stridor.  Abdominal:     General: There is no distension.     Tenderness: There is no abdominal tenderness. There is no guarding or rebound.  Musculoskeletal:        General: Normal range of motion.     Cervical back: Normal range of motion.  Neurological:     Mental Status: She is alert and oriented to person, place, and time.  Psychiatric:        Behavior: Behavior normal.     ED Results and  Treatments Labs (all labs ordered are listed, but only abnormal results are displayed) Labs Reviewed  RESPIRATORY PANEL BY RT PCR (FLU A&B, COVID)  CBG MONITORING, ED  EKG  EKG Interpretation  Date/Time:    Ventricular Rate:    PR Interval:    QRS Duration:   QT Interval:    QTC Calculation:   R Axis:     Text Interpretation:        Radiology No results found.  Pertinent labs & imaging results that were available during my care of the patient were reviewed by me and considered in my medical decision making (see chart for details).  Medications Ordered in ED Medications - No data to display                                                                                                                                  Procedures Procedures  (including critical care time)  Medical Decision Making / ED Course I have reviewed the nursing notes for this encounter and the patient's prior records (if available in EHR or on provided paperwork).   REEGAN MCTIGHE was evaluated in Emergency Department on 01/09/2020 for the symptoms described in the history of present illness. She was evaluated in the context of the global COVID-19 pandemic, which necessitated consideration that the patient might be at risk for infection with the SARS-CoV-2 virus that causes COVID-19. Institutional protocols and algorithms that pertain to the evaluation of patients at risk for COVID-19 are in a state of rapid change based on information released by regulatory bodies including the CDC and federal and state organizations. These policies and algorithms were followed during the patient's care in the ED.  55 y.o. female presents with vomiting and myalgias for 1 day. Denies possible suspicious food intake. Decreased oral tolerance. Rest of history as above.  Patient appears well, not in  distress, and with no signs of toxicity or dehydration. Abdomen benign.  Rest of the exam as above  Most consistent with viral gastroenteritis.   Doubt appendicitis, diverticulitis, severe colitis, dysentery.    Able to tolerate oral intake in the ED without antiemetics.  Discussed symptomatic treatment with the patient and they will follow closely with their PCP.       Final Clinical Impression(s) / ED Diagnoses Final diagnoses:  Nausea and vomiting in adult  Myalgia   The patient appears reasonably screened and/or stabilized for discharge and I doubt any other medical condition or other [facility] requiring further screening, evaluation, or treatment in the ED at this time prior to discharge. Safe for discharge with strict return precautions.  Disposition: Discharge  Condition: Good  I have discussed the results, Dx and Tx plan with the patient/family who expressed understanding and agree(s) with the plan. Discharge instructions discussed at length. The patient/family was given strict return precautions who verbalized understanding of the instructions. No further questions at time of discharge.    ED Discharge Orders         Ordered    ondansetron (ZOFRAN ODT) 4 MG disintegrating tablet  Every 8 hours PRN  01/09/20 0243           Follow Up: Chesley Noon, MD [place] [id number] [phone]  Schedule an appointment as soon as possible for a visit  in 3-5 days, If symptoms do not improve or  worsen      This chart was dictated using voice recognition software.  Despite best efforts to proofread,  errors can occur which can change the documentation meaning.   Fatima Blank, MD 01/09/20 [phone]

## 2020-05-01 ENCOUNTER — Other Ambulatory Visit: Payer: Self-pay | Admitting: Obstetrics and Gynecology

## 2020-11-14 ENCOUNTER — Other Ambulatory Visit: Payer: Self-pay | Admitting: Adult Health Nurse Practitioner

## 2020-12-11 ENCOUNTER — Other Ambulatory Visit: Payer: Self-pay | Admitting: Adult Health Nurse Practitioner

## 2021-02-05 ENCOUNTER — Other Ambulatory Visit: Payer: Self-pay | Admitting: Adult Health Nurse Practitioner

## 2021-03-04 ENCOUNTER — Other Ambulatory Visit: Payer: Self-pay | Admitting: Adult Health Nurse Practitioner

## 2021-03-18 ENCOUNTER — Other Ambulatory Visit: Payer: Self-pay | Admitting: Adult Health Nurse Practitioner

## 2021-03-31 ENCOUNTER — Other Ambulatory Visit: Payer: Self-pay | Admitting: Adult Health Nurse Practitioner

## 2021-04-01 ENCOUNTER — Other Ambulatory Visit: Payer: Self-pay | Admitting: Adult Health Nurse Practitioner

## 2021-09-22 ENCOUNTER — Other Ambulatory Visit: Payer: Self-pay | Admitting: Adult Health Nurse Practitioner

## 2021-10-28 ENCOUNTER — Other Ambulatory Visit: Payer: Self-pay | Admitting: Adult Health Nurse Practitioner

## 2022-02-10 ENCOUNTER — Other Ambulatory Visit: Payer: Self-pay | Admitting: Obstetrics and Gynecology

## 2022-02-10 DIAGNOSIS — R928 Other abnormal and inconclusive findings on diagnostic imaging of breast: Secondary | ICD-10-CM

## 2022-03-12 ENCOUNTER — Other Ambulatory Visit: Payer: Self-pay | Admitting: Obstetrics and Gynecology

## 2022-03-12 ENCOUNTER — Ambulatory Visit
Admission: RE | Admit: 2022-03-12 | Discharge: 2022-03-12 | Disposition: A | Discharge status: Home / Self Care | Payer: Medicare Other | Source: Ambulatory Visit | Attending: Obstetrics and Gynecology | Admitting: Obstetrics and Gynecology

## 2022-03-12 DIAGNOSIS — R928 Other abnormal and inconclusive findings on diagnostic imaging of breast: Secondary | ICD-10-CM

## 2022-03-12 DIAGNOSIS — R921 Mammographic calcification found on diagnostic imaging of breast: Secondary | ICD-10-CM

## 2023-05-20 ENCOUNTER — Encounter: Payer: Self-pay | Admitting: *Deleted

## 2023-06-02 ENCOUNTER — Encounter: Payer: Self-pay | Admitting: Obstetrics and Gynecology

## 2023-06-02 DIAGNOSIS — Z1231 Encounter for screening mammogram for malignant neoplasm of breast: Secondary | ICD-10-CM
